# Patient Record
Sex: Male | Born: 1935 | Race: White | Hispanic: No | Marital: Married
Health system: Southern US, Community
[De-identification: ages and names within clinical notes are randomized; demographics above are authoritative.]

---

## 2003-04-23 ENCOUNTER — Other Ambulatory Visit: Payer: Self-pay

## 2003-04-27 ENCOUNTER — Other Ambulatory Visit: Payer: Self-pay

## 2006-04-16 ENCOUNTER — Inpatient Hospital Stay: Payer: Self-pay | Admitting: Internal Medicine

## 2006-04-16 ENCOUNTER — Other Ambulatory Visit: Payer: Self-pay

## 2006-04-21 ENCOUNTER — Inpatient Hospital Stay: Payer: Self-pay | Admitting: Cardiology

## 2006-04-21 ENCOUNTER — Other Ambulatory Visit: Payer: Self-pay

## 2006-04-26 ENCOUNTER — Ambulatory Visit: Payer: Self-pay | Admitting: Internal Medicine

## 2006-07-10 ENCOUNTER — Ambulatory Visit: Payer: Self-pay | Admitting: Specialist

## 2007-02-27 ENCOUNTER — Inpatient Hospital Stay: Payer: Self-pay | Admitting: Internal Medicine

## 2007-02-27 ENCOUNTER — Other Ambulatory Visit: Payer: Self-pay

## 2007-08-15 ENCOUNTER — Inpatient Hospital Stay: Payer: Self-pay | Admitting: Internal Medicine

## 2007-08-15 ENCOUNTER — Other Ambulatory Visit: Payer: Self-pay

## 2008-12-31 ENCOUNTER — Inpatient Hospital Stay: Payer: Self-pay | Admitting: Internal Medicine

## 2010-10-30 ENCOUNTER — Ambulatory Visit: Payer: Self-pay | Admitting: Physician Assistant

## 2011-02-27 ENCOUNTER — Inpatient Hospital Stay: Payer: Self-pay

## 2012-02-22 ENCOUNTER — Inpatient Hospital Stay: Payer: Self-pay | Admitting: Internal Medicine

## 2012-02-22 LAB — CK TOTAL AND CKMB (NOT AT ARMC): CK-MB: 3 ng/mL (ref 0.5–3.6)

## 2012-02-22 LAB — CBC
HCT: 45.4 % (ref 40.0–52.0)
MCH: 33.4 pg (ref 26.0–34.0)
MCV: 97 fL (ref 80–100)
RBC: 4.7 10*6/uL (ref 4.40–5.90)
WBC: 9.4 10*3/uL (ref 3.8–10.6)

## 2012-02-22 LAB — COMPREHENSIVE METABOLIC PANEL
Albumin: 3.5 g/dL (ref 3.4–5.0)
Anion Gap: 5 — ABNORMAL LOW (ref 7–16)
Chloride: 105 mmol/L (ref 98–107)
Co2: 30 mmol/L (ref 21–32)
Creatinine: 1.02 mg/dL (ref 0.60–1.30)
EGFR (African American): >60
EGFR (Non-African Amer.): >60
Osmolality: 280 (ref 275–301)
Potassium: 3.6 mmol/L (ref 3.5–5.1)
Sodium: 140 mmol/L (ref 136–145)

## 2012-02-22 LAB — PROTIME-INR: INR: 2.1

## 2012-02-22 LAB — URINALYSIS, COMPLETE
Bacteria: NONE SEEN
Blood: NEGATIVE
Glucose,UR: NEGATIVE mg/dL (ref 0–75)
Ketone: NEGATIVE
Protein: 30
Specific Gravity: 1.009 (ref 1.003–1.030)
WBC UR: 4 /HPF (ref 0–5)

## 2012-02-22 LAB — TROPONIN I: Troponin-I: <0.02 ng/mL

## 2012-02-22 LAB — MAGNESIUM: Magnesium: 1.8 mg/dL

## 2012-02-23 LAB — BASIC METABOLIC PANEL
Anion Gap: 4 — ABNORMAL LOW (ref 7–16)
BUN: 17 mg/dL (ref 7–18)
EGFR (African American): >60
EGFR (Non-African Amer.): 60 — ABNORMAL LOW
Glucose: 135 mg/dL — ABNORMAL HIGH (ref 65–99)
Osmolality: 287 (ref 275–301)
Sodium: 142 mmol/L (ref 136–145)

## 2012-02-23 LAB — TROPONIN I: Troponin-I: <0.02 ng/mL

## 2012-02-23 LAB — TSH: Thyroid Stimulating Horm: 0.635 u[IU]/mL

## 2012-02-23 LAB — PRO B NATRIURETIC PEPTIDE: B-Type Natriuretic Peptide: 4140 pg/mL — ABNORMAL HIGH (ref 0–450)

## 2012-02-23 LAB — LIPID PANEL
HDL Cholesterol: 54 mg/dL (ref 40–60)
Ldl Cholesterol, Calc: 44 mg/dL (ref 0–100)
Triglycerides: 36 mg/dL (ref 0–200)

## 2012-02-24 LAB — BASIC METABOLIC PANEL
Anion Gap: 8 (ref 7–16)
Calcium, Total: 8.9 mg/dL (ref 8.5–10.1)
Chloride: 99 mmol/L (ref 98–107)
Co2: 31 mmol/L (ref 21–32)
EGFR (African American): >60
Glucose: 127 mg/dL — ABNORMAL HIGH (ref 65–99)

## 2012-02-24 LAB — CBC WITH DIFFERENTIAL/PLATELET
Basophil #: 0 10*3/uL (ref 0.0–0.1)
Basophil %: 0 %
Eosinophil #: 0 10*3/uL (ref 0.0–0.7)
HCT: 42.7 % (ref 40.0–52.0)
Lymphocyte #: 0.7 10*3/uL — ABNORMAL LOW (ref 1.0–3.6)
Lymphocyte %: 6.4 %
MCH: 32.9 pg (ref 26.0–34.0)
MCHC: 34 g/dL (ref 32.0–36.0)
Monocyte #: 0.5 x10 3/mm (ref 0.2–1.0)
Monocyte %: 4.4 %
Neutrophil #: 10.3 10*3/uL — ABNORMAL HIGH (ref 1.4–6.5)
Platelet: 140 10*3/uL — ABNORMAL LOW (ref 150–440)
RDW: 15.1 % — ABNORMAL HIGH (ref 11.5–14.5)
WBC: 11.6 10*3/uL — ABNORMAL HIGH (ref 3.8–10.6)

## 2012-02-24 LAB — PROTIME-INR: Prothrombin Time: 35.3 secs — ABNORMAL HIGH (ref 11.5–14.7)

## 2012-02-25 LAB — CBC WITH DIFFERENTIAL/PLATELET
Eosinophil #: 0 10*3/uL (ref 0.0–0.7)
Eosinophil %: 0 %
HCT: 42.9 % (ref 40.0–52.0)
Lymphocyte #: 0.8 10*3/uL — ABNORMAL LOW (ref 1.0–3.6)
MCH: 33.6 pg (ref 26.0–34.0)
MCHC: 35 g/dL (ref 32.0–36.0)
MCV: 96 fL (ref 80–100)
Monocyte #: 0.9 x10 3/mm (ref 0.2–1.0)
Platelet: 140 10*3/uL — ABNORMAL LOW (ref 150–440)
RBC: 4.47 10*6/uL (ref 4.40–5.90)
RDW: 14.6 % — ABNORMAL HIGH (ref 11.5–14.5)
WBC: 11.4 10*3/uL — ABNORMAL HIGH (ref 3.8–10.6)

## 2012-02-25 LAB — BASIC METABOLIC PANEL
Anion Gap: 6 — ABNORMAL LOW (ref 7–16)
Calcium, Total: 8.7 mg/dL (ref 8.5–10.1)
Chloride: 99 mmol/L (ref 98–107)
Co2: 34 mmol/L — ABNORMAL HIGH (ref 21–32)
Creatinine: 1.3 mg/dL (ref 0.60–1.30)
EGFR (African American): 57 — ABNORMAL LOW
Sodium: 139 mmol/L (ref 136–145)

## 2012-02-25 LAB — PROTIME-INR
INR: 3.9
Prothrombin Time: 38.2 secs — ABNORMAL HIGH (ref 11.5–14.7)

## 2012-02-27 LAB — CULTURE, BLOOD (SINGLE)

## 2012-05-03 ENCOUNTER — Ambulatory Visit: Payer: Self-pay | Admitting: Internal Medicine

## 2012-05-09 ENCOUNTER — Inpatient Hospital Stay: Payer: Self-pay | Admitting: Internal Medicine

## 2012-05-09 LAB — TROPONIN I: Troponin-I: 0.03 ng/mL

## 2012-05-09 LAB — COMPREHENSIVE METABOLIC PANEL
Albumin: 3.5 g/dL (ref 3.4–5.0)
BUN: 14 mg/dL (ref 7–18)
Calcium, Total: 8.8 mg/dL (ref 8.5–10.1)
Chloride: 105 mmol/L (ref 98–107)
Co2: 29 mmol/L (ref 21–32)
Creatinine: 1.1 mg/dL (ref 0.60–1.30)
EGFR (African American): >60
EGFR (Non-African Amer.): >60
Glucose: 127 mg/dL — ABNORMAL HIGH (ref 65–99)
Potassium: 3.6 mmol/L (ref 3.5–5.1)
SGOT(AST): 38 U/L — ABNORMAL HIGH (ref 15–37)
SGPT (ALT): 49 U/L (ref 12–78)
Total Protein: 6.6 g/dL (ref 6.4–8.2)

## 2012-05-09 LAB — TSH: Thyroid Stimulating Horm: 3.15 u[IU]/mL

## 2012-05-09 LAB — CBC
HCT: 48.1 % (ref 40.0–52.0)
HGB: 16.2 g/dL (ref 13.0–18.0)
MCH: 33 pg (ref 26.0–34.0)
MCHC: 33.6 g/dL (ref 32.0–36.0)
RDW: 15.7 % — ABNORMAL HIGH (ref 11.5–14.5)

## 2012-05-09 LAB — PROTIME-INR: INR: 3.1

## 2012-05-09 LAB — CK TOTAL AND CKMB (NOT AT ARMC)
CK, Total: 108 U/L (ref 35–232)
CK-MB: 3 ng/mL (ref 0.5–3.6)

## 2012-05-10 LAB — BASIC METABOLIC PANEL
Creatinine: 1.27 mg/dL (ref 0.60–1.30)
EGFR (African American): 59 — ABNORMAL LOW
EGFR (Non-African Amer.): 51 — ABNORMAL LOW
Glucose: 134 mg/dL — ABNORMAL HIGH (ref 65–99)
Osmolality: 281 (ref 275–301)
Potassium: 4.2 mmol/L (ref 3.5–5.1)
Sodium: 139 mmol/L (ref 136–145)

## 2012-05-10 LAB — CBC WITH DIFFERENTIAL/PLATELET
Basophil %: 0.3 %
Eosinophil #: 0 10*3/uL (ref 0.0–0.7)
Eosinophil %: 0 %
HGB: 13.3 g/dL (ref 13.0–18.0)
Lymphocyte #: 0.6 10*3/uL — ABNORMAL LOW (ref 1.0–3.6)
MCH: 32.4 pg (ref 26.0–34.0)
MCHC: 33.4 g/dL (ref 32.0–36.0)
MCV: 97 fL (ref 80–100)
Platelet: 110 10*3/uL — ABNORMAL LOW (ref 150–440)
RBC: 4.09 10*6/uL — ABNORMAL LOW (ref 4.40–5.90)
RDW: 15.7 % — ABNORMAL HIGH (ref 11.5–14.5)

## 2012-05-10 LAB — PROTIME-INR
INR: 3.5
Prothrombin Time: 33.9 secs — ABNORMAL HIGH (ref 11.5–14.7)

## 2012-05-10 LAB — CK: CK, Total: 86 U/L (ref 35–232)

## 2012-05-11 LAB — BASIC METABOLIC PANEL
BUN: 16 mg/dL (ref 7–18)
Calcium, Total: 8.5 mg/dL (ref 8.5–10.1)
Co2: 30 mmol/L (ref 21–32)
Creatinine: 1.07 mg/dL (ref 0.60–1.30)
EGFR (African American): >60
Glucose: 86 mg/dL (ref 65–99)
Sodium: 141 mmol/L (ref 136–145)

## 2012-05-11 LAB — MAGNESIUM: Magnesium: 1.7 mg/dL — ABNORMAL LOW

## 2012-05-11 LAB — PROTIME-INR
INR: 4.3
Prothrombin Time: 39.4 secs — ABNORMAL HIGH (ref 11.5–14.7)

## 2012-05-12 LAB — MAGNESIUM: Magnesium: 2 mg/dL

## 2012-05-12 LAB — BASIC METABOLIC PANEL
BUN: 18 mg/dL (ref 7–18)
Chloride: 101 mmol/L (ref 98–107)
Co2: 33 mmol/L — ABNORMAL HIGH (ref 21–32)
EGFR (Non-African Amer.): >60
Sodium: 138 mmol/L (ref 136–145)

## 2012-05-12 LAB — PROTIME-INR: INR: 2.9

## 2012-05-13 LAB — BASIC METABOLIC PANEL
Anion Gap: 2 — ABNORMAL LOW (ref 7–16)
EGFR (African American): >60
EGFR (Non-African Amer.): 57 — ABNORMAL LOW
Glucose: 128 mg/dL — ABNORMAL HIGH (ref 65–99)
Osmolality: 279 (ref 275–301)
Potassium: 4 mmol/L (ref 3.5–5.1)
Sodium: 137 mmol/L (ref 136–145)

## 2012-05-13 LAB — PROTIME-INR
INR: 2.5
Prothrombin Time: 26.4 secs — ABNORMAL HIGH (ref 11.5–14.7)

## 2012-05-14 LAB — CULTURE, BLOOD (SINGLE)

## 2012-05-14 LAB — CBC WITH DIFFERENTIAL/PLATELET
Basophil #: 0.1 10*3/uL (ref 0.0–0.1)
Basophil %: 0.6 %
Eosinophil %: 6.1 %
MCHC: 34.2 g/dL (ref 32.0–36.0)
MCV: 97 fL (ref 80–100)
Monocyte #: 0.7 x10 3/mm (ref 0.2–1.0)
Neutrophil #: 5.8 10*3/uL (ref 1.4–6.5)
Neutrophil %: 65.2 %
Platelet: 140 10*3/uL — ABNORMAL LOW (ref 150–440)
RDW: 15.3 % — ABNORMAL HIGH (ref 11.5–14.5)
WBC: 8.9 10*3/uL (ref 3.8–10.6)

## 2012-05-26 ENCOUNTER — Inpatient Hospital Stay: Payer: Self-pay

## 2012-05-26 LAB — TROPONIN I
Troponin-I: <0.02 ng/mL
Troponin-I: <0.02 ng/mL

## 2012-05-26 LAB — CBC WITH DIFFERENTIAL/PLATELET
Eosinophil #: 0 10*3/uL (ref 0.0–0.7)
Eosinophil %: 0.3 %
HCT: 43.5 % (ref 40.0–52.0)
HGB: 14.7 g/dL (ref 13.0–18.0)
Lymphocyte #: 0.9 10*3/uL — ABNORMAL LOW (ref 1.0–3.6)
MCHC: 33.7 g/dL (ref 32.0–36.0)
Monocyte %: 6 %
Neutrophil #: 6.9 10*3/uL — ABNORMAL HIGH (ref 1.4–6.5)
Platelet: 139 10*3/uL — ABNORMAL LOW (ref 150–440)
RDW: 15.5 % — ABNORMAL HIGH (ref 11.5–14.5)
WBC: 8.3 10*3/uL (ref 3.8–10.6)

## 2012-05-26 LAB — COMPREHENSIVE METABOLIC PANEL
Albumin: 3.3 g/dL — ABNORMAL LOW (ref 3.4–5.0)
Alkaline Phosphatase: 106 U/L (ref 50–136)
Anion Gap: 6 — ABNORMAL LOW (ref 7–16)
BUN: 19 mg/dL — ABNORMAL HIGH (ref 7–18)
Bilirubin,Total: 1.1 mg/dL — ABNORMAL HIGH (ref 0.2–1.0)
Calcium, Total: 8.9 mg/dL (ref 8.5–10.1)
Chloride: 106 mmol/L (ref 98–107)
Creatinine: 1.03 mg/dL (ref 0.60–1.30)
EGFR (Non-African Amer.): >60
Glucose: 94 mg/dL (ref 65–99)
Potassium: 4.4 mmol/L (ref 3.5–5.1)
SGOT(AST): 32 U/L (ref 15–37)
SGPT (ALT): 26 U/L (ref 12–78)
Total Protein: 6.4 g/dL (ref 6.4–8.2)

## 2012-05-26 LAB — PROTIME-INR
INR: 1.8
Prothrombin Time: 21 secs — ABNORMAL HIGH (ref 11.5–14.7)

## 2012-05-26 LAB — SEDIMENTATION RATE: Erythrocyte Sed Rate: 2 mm/hr (ref 0–20)

## 2012-05-26 LAB — CK TOTAL AND CKMB (NOT AT ARMC)
CK, Total: 56 U/L (ref 35–232)
CK-MB: 1.7 ng/mL (ref 0.5–3.6)
CK-MB: 2 ng/mL (ref 0.5–3.6)

## 2012-05-26 LAB — URINALYSIS, COMPLETE
Nitrite: NEGATIVE
Ph: 7 (ref 4.5–8.0)
Specific Gravity: 1.002 (ref 1.003–1.030)
WBC UR: 2 /HPF (ref 0–5)

## 2012-05-27 LAB — BASIC METABOLIC PANEL
Anion Gap: 3 — ABNORMAL LOW (ref 7–16)
Calcium, Total: 9.2 mg/dL (ref 8.5–10.1)
Co2: 31 mmol/L (ref 21–32)
Creatinine: 1.02 mg/dL (ref 0.60–1.30)
EGFR (African American): >60
Glucose: 145 mg/dL — ABNORMAL HIGH (ref 65–99)
Osmolality: 284 (ref 275–301)
Sodium: 140 mmol/L (ref 136–145)

## 2012-05-27 LAB — CBC WITH DIFFERENTIAL/PLATELET
Basophil #: 0 10*3/uL (ref 0.0–0.1)
Eosinophil #: 0 10*3/uL (ref 0.0–0.7)
HCT: 41.6 % (ref 40.0–52.0)
HGB: 14.3 g/dL (ref 13.0–18.0)
Lymphocyte #: 0.5 10*3/uL — ABNORMAL LOW (ref 1.0–3.6)
MCH: 33.1 pg (ref 26.0–34.0)
MCHC: 34.4 g/dL (ref 32.0–36.0)
Monocyte #: 0.1 x10 3/mm — ABNORMAL LOW (ref 0.2–1.0)
Monocyte %: 2 %
Neutrophil #: 4.9 10*3/uL (ref 1.4–6.5)
Neutrophil %: 88.5 %
Platelet: 129 10*3/uL — ABNORMAL LOW (ref 150–440)
RBC: 4.32 10*6/uL — ABNORMAL LOW (ref 4.40–5.90)
WBC: 5.6 10*3/uL (ref 3.8–10.6)

## 2012-05-27 LAB — TROPONIN I: Troponin-I: <0.02 ng/mL

## 2012-05-27 LAB — CK TOTAL AND CKMB (NOT AT ARMC): CK-MB: 1.3 ng/mL (ref 0.5–3.6)

## 2012-05-28 LAB — BASIC METABOLIC PANEL
Anion Gap: 3 — ABNORMAL LOW (ref 7–16)
Calcium, Total: 8.7 mg/dL (ref 8.5–10.1)
Co2: 34 mmol/L — ABNORMAL HIGH (ref 21–32)
Creatinine: 1.13 mg/dL (ref 0.60–1.30)
EGFR (African American): >60
EGFR (Non-African Amer.): 59 — ABNORMAL LOW
Glucose: 90 mg/dL (ref 65–99)
Potassium: 3.6 mmol/L (ref 3.5–5.1)
Sodium: 139 mmol/L (ref 136–145)

## 2012-05-28 LAB — PLATELET COUNT: Platelet: 148 10*3/uL — ABNORMAL LOW (ref 150–440)

## 2012-05-29 LAB — BASIC METABOLIC PANEL
Anion Gap: 2 — ABNORMAL LOW (ref 7–16)
BUN: 20 mg/dL — ABNORMAL HIGH (ref 7–18)
Chloride: 101 mmol/L (ref 98–107)
Co2: 36 mmol/L — ABNORMAL HIGH (ref 21–32)
EGFR (African American): >60
EGFR (Non-African Amer.): >60
Glucose: 78 mg/dL (ref 65–99)
Potassium: 4.2 mmol/L (ref 3.5–5.1)

## 2012-05-30 LAB — BASIC METABOLIC PANEL
Anion Gap: 4 — ABNORMAL LOW (ref 7–16)
BUN: 30 mg/dL — ABNORMAL HIGH (ref 7–18)
Calcium, Total: 9.2 mg/dL (ref 8.5–10.1)
Chloride: 96 mmol/L — ABNORMAL LOW (ref 98–107)
Chloride: 94 mmol/L — ABNORMAL LOW (ref 98–107)
Co2: 35 mmol/L — ABNORMAL HIGH (ref 21–32)
EGFR (African American): 38 — ABNORMAL LOW
EGFR (Non-African Amer.): 33 — ABNORMAL LOW
Glucose: 114 mg/dL — ABNORMAL HIGH (ref 65–99)
Osmolality: 277 (ref 275–301)
Potassium: 4 mmol/L (ref 3.5–5.1)
Potassium: 4 mmol/L (ref 3.5–5.1)

## 2012-05-30 LAB — CBC WITH DIFFERENTIAL/PLATELET
Basophil #: 0 10*3/uL (ref 0.0–0.1)
Eosinophil #: 0 10*3/uL (ref 0.0–0.7)
Eosinophil %: 0 %
HCT: 43.7 % (ref 40.0–52.0)
HGB: 15.3 g/dL (ref 13.0–18.0)
Lymphocyte #: 0.8 10*3/uL — ABNORMAL LOW (ref 1.0–3.6)
Lymphocyte %: 4.4 %
MCH: 33.8 pg (ref 26.0–34.0)
MCV: 97 fL (ref 80–100)
Monocyte #: 1 x10 3/mm (ref 0.2–1.0)
Neutrophil #: 15.7 10*3/uL — ABNORMAL HIGH (ref 1.4–6.5)
Neutrophil %: 90.1 %
Platelet: 144 10*3/uL — ABNORMAL LOW (ref 150–440)
WBC: 17.4 10*3/uL — ABNORMAL HIGH (ref 3.8–10.6)

## 2012-05-30 LAB — PROTIME-INR
INR: 2.6
Prothrombin Time: 26.8 secs — ABNORMAL HIGH (ref 11.5–14.7)

## 2012-05-31 LAB — BASIC METABOLIC PANEL
Anion Gap: 3 — ABNORMAL LOW (ref 7–16)
Calcium, Total: 9.6 mg/dL (ref 8.5–10.1)
Chloride: 96 mmol/L — ABNORMAL LOW (ref 98–107)
Glucose: 108 mg/dL — ABNORMAL HIGH (ref 65–99)
Osmolality: 284 (ref 275–301)
Potassium: 4.3 mmol/L (ref 3.5–5.1)

## 2012-05-31 LAB — PROTIME-INR: INR: 2.6

## 2012-05-31 LAB — CBC WITH DIFFERENTIAL/PLATELET
Basophil #: 0 10*3/uL (ref 0.0–0.1)
Basophil %: 0 %
Eosinophil #: 0 10*3/uL (ref 0.0–0.7)
Eosinophil %: 0 %
HGB: 15.6 g/dL (ref 13.0–18.0)
Lymphocyte #: 1 10*3/uL (ref 1.0–3.6)
Lymphocyte %: 7.2 %
MCHC: 33.6 g/dL (ref 32.0–36.0)
MCV: 98 fL (ref 80–100)
Neutrophil #: 11.6 10*3/uL — ABNORMAL HIGH (ref 1.4–6.5)
RBC: 4.76 10*6/uL (ref 4.40–5.90)

## 2012-05-31 LAB — CREATININE, SERUM
Creatinine: 1.7 mg/dL — ABNORMAL HIGH (ref 0.60–1.30)
EGFR (Non-African Amer.): 36 — ABNORMAL LOW

## 2012-06-01 LAB — CBC WITH DIFFERENTIAL/PLATELET
Basophil #: 0 10*3/uL (ref 0.0–0.1)
Eosinophil #: 0 10*3/uL (ref 0.0–0.7)
HCT: 44.1 % (ref 40.0–52.0)
HGB: 15.4 g/dL (ref 13.0–18.0)
Lymphocyte #: 1 10*3/uL (ref 1.0–3.6)
Lymphocyte %: 7.8 %
MCH: 33.6 pg (ref 26.0–34.0)
MCHC: 34.8 g/dL (ref 32.0–36.0)
MCV: 97 fL (ref 80–100)
Monocyte #: 0.9 x10 3/mm (ref 0.2–1.0)
Neutrophil %: 84.9 %
RBC: 4.57 10*6/uL (ref 4.40–5.90)
RDW: 14.9 % — ABNORMAL HIGH (ref 11.5–14.5)
WBC: 12.9 10*3/uL — ABNORMAL HIGH (ref 3.8–10.6)

## 2012-06-01 LAB — BASIC METABOLIC PANEL
Anion Gap: 2 — ABNORMAL LOW (ref 7–16)
BUN: 32 mg/dL — ABNORMAL HIGH (ref 7–18)
Calcium, Total: 9.3 mg/dL (ref 8.5–10.1)
Chloride: 98 mmol/L (ref 98–107)
Creatinine: 1.35 mg/dL — ABNORMAL HIGH (ref 0.60–1.30)
EGFR (Non-African Amer.): 47 — ABNORMAL LOW
Glucose: 107 mg/dL — ABNORMAL HIGH (ref 65–99)
Osmolality: 281 (ref 275–301)
Sodium: 137 mmol/L (ref 136–145)

## 2012-06-01 LAB — CULTURE, BLOOD (SINGLE)

## 2012-06-03 ENCOUNTER — Ambulatory Visit: Payer: Self-pay | Admitting: Internal Medicine

## 2012-12-06 ENCOUNTER — Emergency Department: Payer: Self-pay | Admitting: Emergency Medicine

## 2012-12-06 LAB — COMPREHENSIVE METABOLIC PANEL
Albumin: 3.2 g/dL — ABNORMAL LOW (ref 3.4–5.0)
Alkaline Phosphatase: 88 U/L (ref 50–136)
BUN: 23 mg/dL — ABNORMAL HIGH (ref 7–18)
Bilirubin,Total: 1.4 mg/dL — ABNORMAL HIGH (ref 0.2–1.0)
Co2: 29 mmol/L (ref 21–32)
Creatinine: 1.55 mg/dL — ABNORMAL HIGH (ref 0.60–1.30)
EGFR (African American): 46 — ABNORMAL LOW
Glucose: 95 mg/dL (ref 65–99)
Osmolality: 279 (ref 275–301)
SGPT (ALT): 15 U/L (ref 12–78)
Sodium: 138 mmol/L (ref 136–145)
Total Protein: 6.1 g/dL — ABNORMAL LOW (ref 6.4–8.2)

## 2012-12-06 LAB — URINALYSIS, COMPLETE
Bilirubin,UR: NEGATIVE
Glucose,UR: NEGATIVE mg/dL (ref 0–75)
Leukocyte Esterase: NEGATIVE
Ph: 6 (ref 4.5–8.0)
Protein: NEGATIVE
RBC,UR: NONE SEEN /HPF (ref 0–5)
Specific Gravity: 1.011 (ref 1.003–1.030)
Squamous Epithelial: <1

## 2012-12-06 LAB — CBC WITH DIFFERENTIAL/PLATELET
Basophil %: 0.6 %
Eosinophil #: 0.5 10*3/uL (ref 0.0–0.7)
Eosinophil %: 4.7 %
HCT: 40.8 % (ref 40.0–52.0)
HGB: 14.4 g/dL (ref 13.0–18.0)
MCHC: 35.4 g/dL (ref 32.0–36.0)
MCV: 98 fL (ref 80–100)
Monocyte #: 0.8 x10 3/mm (ref 0.2–1.0)
Neutrophil #: 7.3 10*3/uL — ABNORMAL HIGH (ref 1.4–6.5)
Neutrophil %: 72.3 %
Platelet: 122 10*3/uL — ABNORMAL LOW (ref 150–440)
RBC: 4.15 10*6/uL — ABNORMAL LOW (ref 4.40–5.90)
RDW: 14.3 % (ref 11.5–14.5)
WBC: 10.1 10*3/uL (ref 3.8–10.6)

## 2012-12-06 LAB — CK TOTAL AND CKMB (NOT AT ARMC)
CK, Total: 33 U/L — ABNORMAL LOW (ref 35–232)
CK-MB: 0.9 ng/mL (ref 0.5–3.6)

## 2012-12-06 LAB — PROTIME-INR
INR: 1
Prothrombin Time: 13.5 secs (ref 11.5–14.7)

## 2012-12-06 LAB — TROPONIN I: Troponin-I: <0.02 ng/mL

## 2013-02-02 DEATH — deceased

## 2014-06-22 NOTE — H&P (Signed)
PATIENT NAMCloria Rush:  [name] MR#:  [id number] DATE OF BIRTH:  27-Apr-1923  DATE OF ADMISSION:  02/22/2012  PRIMARY CARE PHYSICIAN: Daniel NonesBert Klein, MD  REFERRING PHYSICIAN: Malachy Moanevainder Goli, MD  CHIEF COMPLAINT: Shortness of breath for 2 weeks, worsening today.   HISTORY OF PRESENT ILLNESS: The patient is an 81 year old Caucasian male with a past medical history of chronic respiratory failure, congestive heart failure, COPD, CAD, hyperlipidemia and hypertension who presented to the ED with shortness of breath for two weeks, worsening today. The patient has had shortness of breath for the past two weeks. In addition, the patient has a cough with discolored sputum, worsening leg edema, orthopnea and weight gain. The patient is on 3 liters home oxygen. He has been compliant to all the medications, but symptoms became worse today. He could not catch his breath so he was brought to the ED for further evaluation. He had low O2 oxygen saturation and was placed on BiPAP.   PAST MEDICAL HISTORY:  1. Chronic respiratory failure with COPD and pulmonary fibrosis, on home oxygen, 3 liters.  2. CAD status post RCA stent in 2005. 3. Hyperlipidemia.  4. Venous insufficiency with chronic lower extremity edema and varicosities. 5. Diastolic dysfunction CHF. 6. Atrial fibrillation, on Coumadin.  7. Hypertension.  8. Degenerative joint disease with multiple back surgeries and history of back fracture.   PERSONAL HISTORY: Denies any smoking or drinking or illicit drugs, but has distant tobacco use.   PAST SURGICAL HISTORY:  1. Back surgery. 2. Bilateral cataract surgery. 3. Tonsillectomy.   DRUG ALLERGIES: PENICILLIN.   FAMILY HISTORY: Father died of stroke and had a history of cancer. Mother had multiple sclerosis. A sister had cancer. Another brother had brain cancer.   HOME MEDICATIONS: 1. Warfarin 3 mg p.o. daily.  2. Vitamin D3 1000 units p.o. twice a day. 3. Vitamin C 1000 mg p.o. daily.   4. Metaxalone 800 mg p.o. tablet 1/2 to 1 tablet p.o. a day p.Rush.n.  5. Meclizine 12.5 mg p.o. 3 times daily p.Rush.n.  6. Levothyroxine 50 mcg p.o. once a day p.Rush.n.  7. HCTZ 12.5 mg p.o. daily.  8. Fish oil 1200 mg p.o. capsule 1 capsule twice a day. 9. DuoNebs 0.5 mg/2.5 mg/3 mL inhalation, 3 mL inhalation every 8 hours p.Rush.n.  10. Colace 100 mg p.o. daily.  11. Crestor 40 mg p.o. at bedtime.  12. Cardizem CD 300 mg p.o. daily.  13. Atenolol 50 mg p.o. 1 to 1-1/2 tablets p.o. daily.  14. Aspirin 81 mg p.o. daily.  15. Advair 100 mcg/50 mcg inhalation powder 1 puff once a daily.  REVIEW OF SYSTEMS:   CONSTITUTIONAL: The patient denies any fever or chills. No headache or dizziness. No weakness but has weight gain.   EYES: No double vision or blurred vision.   ENT: No postnasal drip, slurred speech or dysphagia. No epistaxis.   CARDIOVASCULAR: No chest pain or palpitations, but has orthopnea and questionable nocturnal dyspnea and also has leg edema.   PULMONARY: Positive for cough, sputum and shortness of breath, but no wheezing or hemoptysis.   GASTROINTESTINAL: No abdominal pain, nausea, vomiting or diarrhea. No melena or bloody stool.   GENITOURINARY: No dysuria, hematuria or incontinence.   SKIN: No rash or jaundice.   NEUROLOGY: No syncope, loss of consciousness or seizure.   HEMATOLOGY: No easy bruising or bleeding.   ENDOCRINE: No polyuria, polydipsia heat or cold intolerance.   PHYSICAL EXAMINATION:  VITAL SIGNS: Blood pressure 140/81, respirations 42  and 02 saturation 97% on BiPAP.   GENERAL: The patient is alert, awake and oriented, in no acute distress.   HEENT: Pupils are round, equal and reactive to light and accommodation. Moist oral mucosa. Clear oropharynx.   NECK: Supple. Mild JVD. No carotid bruits. No lymphadenopathy. No thyromegaly.   CARDIOVASCULAR: S1 and S2 irregular rate and rhythm, mild tachycardia. No murmurs or gallops.   PULMONARY: Bilateral  air entry. No wheezing but has rales bilaterally, especially in the right base.  No use of accessory muscles to breathe.   ABDOMEN: Soft. No distention or tenderness. Obese. Bowel sounds present. No organomegaly.   EXTREMITIES: Bilateral leg edema, 2 to 3+. No tenderness, clubbing or cyanosis. Pedal pulses present.   SKIN: No rash or jaundice.   NEUROLOGY: Alert and oriented x3. No focal deficit. Power 5/5. Sensation intact.   LABORATORY DATA: Urinalysis is negative. CK 116 and CK-MB 3.0. Glucose 89, BUN 15 and creatinine 1.02. Electrolytes are normal. CBC normal. Troponin less than 0.02. BNP 3343. Magnesium 1.8. INR 2.1.   RADIOLOGIC/DIAGNOSTIC DATA: Chest x-ray showed mild hypoinflated lungs with interstitial markings, low-grade CHF with small bilateral effusions, especially on the left side.   EKG shows sinus rhythm at 90 bpm with incomplete RBBB.   IMPRESSION: 1. Acute on chronic congestive heart failure, diastolic dysfunction.  2. Acute on chronic respiratory failure.  3. Chronic obstructive pulmonary disease.  4. Coronary artery disease.  5. Hypertension.  6. Hyperlipidemia.  7. Atrial fibrillation, on Coumadin.   PLAN OF TREATMENT: The patient will be admitted to the telemetry floor. We will start Lasix 20 mg IV twice a day and continue HCTZ. We will start CHF protocol with fluid restriction and daily weights. Continue aspirin, atenolol, Cardizem and HCTZ. Continue Crestor.  For COPD, we will continue DuoNebs p.Rush.n. with Advair. GI and DVT prophylaxis.   I discussed the patient's situation and plan of treatment with the patient, the patient's wife and the son.   TIME SPENT: About 62 minutes.  ___________________________ Shaune Pollack, MD qc:sb D: 02/22/2012 13:16:02 ET T: 02/22/2012 13:44:50 ET JOB#: [id number]  cc: Shaune Pollack, MD, <Dictator> Shaune Pollack MD ELECTRONICALLY SIGNED 02/22/2012 16:47

## 2014-06-22 NOTE — Discharge Summary (Signed)
PATIENT NAMEJOURDIN, [name] MR#:  [id number] DATE OF BIRTH:  Dec 07, 1923  DATE OF ADMISSION:  02/22/2012 DATE OF DISCHARGE:  02/25/2012  FINAL DIAGNOSES: 1. Acute on chronic left-sided systolic congestive heart failure.  2. Chronic obstructive pulmonary disease with acute exacerbation.  3. Acute on chronic respiratory failure secondary to #1 and #2.  4. Pulmonary fibrosis.  5. Coronary artery disease.  6. Hyperlipidemia.  7. Chronic venous insufficiency.  8. Atrial fibrillation, on chronic Coumadin therapy.  9. Hypertension.  10. Degenerative joint disease.  HISTORY AND PHYSICAL: Please see dictated admission history and physical.   SUMMARY OF HOSPITAL COURSE: The patient was admitted after being found to have marked hypoxia, with films revealing evidence of pulmonary edema and heart failure, as well as examination revealing evidence of COPD exacerbation.  He was placed on diuretics, started on steroids and treatment for his emphysema, and showed slow but steady improvement with this. His diuretics were held once his blood pressure began to fall, and his medications were adjusted to accommodate this. He was weaned down to 6 liters nasal cannula, which is the recommended baseline for the patient, although he has history of not being entirely compliant with this. He ambulated with physical therapy, and was able to go 110 feet, with his saturation ranging 84 to 88% on this, which is actually the patient's baseline.   The patient felt ready to go home and wished to do so. We had talked about rehabilitation which he was not interested in. We had talked about home health nursing and physical therapy for the patient, which he also declined. His family members are present, and they feel like he is close to baseline and is also ready to go home, so at this point he will be discharged home in stable condition. We recommend that he weigh himself daily and call his physician for more than 2 pounds gain in  one day or 5 pounds in one week or any other increasing signs and symptoms of congestive heart failure. Echocardiogram has been performed as his EF is above 40%, again this appears to be chronic left-sided diastolic congestive heart failure. He will follow a 2 gram sodium diet. He will follow up in our office within the next one week.   DISCHARGE MEDICATIONS: 1. Ranitidine 150 mg p.o. daily. 2. Advair 100/50 one puff twice a day. 3. Coumadin 2 mg at bedtime, starting on 02/27/2012.  4. Furosemide 40 mg p.o. daily p.r.n. edema or shortness of breath.  5. Levaquin 250 mg p.o. daily x 7 days.  6. DuoNeb SVN four times daily.  7. Metoprolol tartrate 25 mg 1/2 tablet p.o. twice a day. 8. Prednisone taper as directed.  9. Potassium 10 mEq p.o. daily p.r.n. with furosemide.  10. Levothyroxine 0.5 mg p.o. daily.  11. Metaxalone 800 mg 1/2 to 1 tablet p.o. daily as needed for back pain.  12. Colace 100 mg p.o. daily. 13. Vitamin D 1000 units p.o. twice a day.  14. Crestor 40 mg p.o. at bedtime.  15. Cardizem CD 300 mg p.o. daily.  16. Meclizine 12.5 mg p.o. 3 times daily as needed for dizziness.   The patient is not on aspirin secondary to his use of Coumadin and concerns about bleeding for this.   He was given instructions to hold atenolol, hydrochlorothiazide, fish oil, and vitamin C.  ____________________________ Lynnea Ferrier, MD bjk:sb D: 02/25/2012 13:46:16 ET T: 02/25/2012 13:53:48 ET JOB#: [id number]  cc: Curtis Sites III, MD, <  Dictator> Daniel NonesBERT KLEIN MD ELECTRONICALLY SIGNED 02/26/2012 8:14

## 2014-06-25 NOTE — Consult Note (Signed)
PATIENT NAMCloria Spring:  Rush, [name] MR#:  [id number] DATE OF BIRTH:  [birth year]  DATE OF CONSULTATION:  12/06/2012  REFERRING PHYSICIAN:  Dr. Fanny BienQuale in the Emergency Room.  CONSULTING PHYSICIAN:  Rick LopeJeffrey D. Judithann SheenSparks, MD  FAMILY PHYSICIAN:  Dr. Daniel NonesBert Rush.   REASON FOR ADMISSION:  Back pain.   HISTORY OF PRESENT ILLNESS:  The patient is an 81 year old male followed by Hospice with a history of chronic a-fib, chronic systolic CHF, pulmonary fibrosis, COPD, and chronic respiratory failure on oxygen. He has been at home being followed by Hospice. He presents to the Emergency Room with a week of worsening back pain, constipation. The patient has a significant history of end-stage lung disease and has been on steroids chronically in the past. In the Emergency Room, the patient was found to have an L4 compression fracture with 20% compression anteriorly and 15% compression posteriorly. No history of recent falls. The patient has been constipated. The family in the past deferred placement in a skilled nursing facility. They have also deferred Hospice Home. They want the patient to be comfortable and be kept at home as long as possible. The patient denies change in respiratory status. Denies chest pain or worsening shortness of breath.   PAST MEDICAL HISTORY:  1.  Pulmonary fibrosis.  2.  COPD.  3.  Chronic respiratory failure.  4.  ASCD status post PTCA with stent placement.  5.  Chronic atrial fibrillation.  6.  Chronic congestive heart failure.  7.  Venous stasis with peripheral edema.  8.  Osteoarthritis.  9.  Previous back surgery.  10.  Benign hypertension.   MEDICATIONS ON ADMISSION:  1.  Vitamin D3 1000 units p.o. b.i.d.  2.  Zantac 150 mg p.o. daily.  3.  Lopressor 25 mg p.o. b.i.d.  4.  Synthroid 0.05 mg p.o. daily. 5.  Lasix 80 mg p.o. daily.  6.  Advair 250/50 1 puff b.i.d.  7.  DuoNeb SVNs q.i.d.  8.  Cardizem CD 240 mg p.o. daily.  9.  Crestor 40 mg p.o. daily.  10.  Aspirin 81 mg p.o.  daily.   ALLERGIES:  PENICILLIN.   SOCIAL HISTORY:  The patient is a former smoker. No history of alcohol abuse.   FAMILY HISTORY:  Positive for stroke, multiple sclerosis, brain cancer, and other primary cancer, unknown.   REVIEW OF SYSTEMS:  CONSTITUTIONAL:  No fever or change in weight.  EYES:  No blurred or double vision. No glaucoma.  ENT:  No tinnitus or hearing loss. No nasal discharge or bleeding. No difficulty swallowing.  RESPIRATORY:  Has chronic cough, but denies wheezing. No hemoptysis. No painful respiration.  CARDIOVASCULAR:  No chest pain or palpitations. No syncope.  GASTROINTESTINAL:  No nausea, vomiting, or diarrhea. No abdominal pain. Has had constipation.  GENITOURINARY:  No dysuria or hematuria. No incontinence.  ENDOCRINE:  No polyuria or polydipsia. No heat or cold intolerance.  HEMATOLOGIC:  Denies anemia, easy bruising, or bleeding.  LYMPHATIC:  No swollen glands.  MUSCULOSKELETAL:  Has pain in his back but denies pain in his neck, shoulders, knees, or hips. No gout.  NEUROLOGIC:  No numbness or migraines. Denies stroke or seizures.  PSYCHIATRIC:  Denies anxiety, insomnia or depression.   PHYSICAL EXAMINATION:  GENERAL:  The patient is chronically ill-appearing, but in no acute distress.  VITAL SIGNS:  Currently remarkable for a blood pressure of 100/60, with a heart rate of 110 and respiratory rate of 23. Temperature 97.8.  HEENT:  Normocephalic, atraumatic. Pupils  equally round and reactive to light and accommodation. Extraocular movements are intact. Sclerae anicteric. Conjunctivae are clear.  OROPHARYNX:  Clear.  NECK:  Supple without JVD. No adenopathy or thyromegaly is noted.  LUNGS:  Reveal decreased breath sounds with basilar crackles. No significant rhonchi or wheezes. Respiratory effort is normal.  CARDIAC:  Irregularly irregular rhythm with a rapid rate. Normal S1, S2. No significant murmurs.  ABDOMEN:  Soft, nontender, with normoactive bowel  sounds. No organomegaly or masses were appreciated. No hernias or bruits were noted.  EXTREMITIES:  1+ edema. No clubbing or cyanosis. Pulses were 1+ bilaterally.  SKIN:  Warm and dry without rash or lesions.  NEUROLOGIC:  Cranial nerves II through XII grossly intact. Deep tendon reflexes were symmetric. Motor and sensory exam is nonfocal.  PSYCHIATRIC:  Revealed a patient is alert and oriented to person, place, and time. He was cooperative and used good judgment.   LABORATORY DATA:  CT of the abdomen revealed nephrolithiasis with mild gallbladder distention with stones. There is no gallbladder wall thickening or pericholecystic fluid. There is the L4 compression fracture noted above. Chest x-ray revealed mild chronic CHF, but was otherwise unremarkable. His white count was 10.1 with a hemoglobin of 14.4. Glucose 95 with a BUN of 13, creatinine of 1.55 with a sodium 138 and a potassium of 3.2, with a GFR of 40. His troponin was less than 0.02. Urinalysis was unremarkable.   ASSESSMENT:  1.  L4 compression fracture with back pain.  2.  Chronic respiratory failure on oxygen.  3.  Chronic obstructive pulmonary disease.  4.  Pulmonary fibrosis.  5.  Chronic atrial fibrillation.  6.  Chronic congestive heart failure.  7.  Chronic steroid use.  8.  Hypokalemia.  9.  Stage III chronic kidney disease.   PLAN:  The patient is not a candidate for surgical intervention for his lumbar compression fracture. The family was made aware of this. Pain management and physical therapy were recommended. The family prefers to do this at home if at all possible. They do not want the patient placed in a nursing home or the [facility]osp[facility]. They would prefer him not to be hospitalized if at all possible. As a result, Hospice was contacted and we will increase his pain medications. He will begin Roxanol 20 mg/mL 0.5 to 1 mL every two hours as needed for pain. We will add a Duragesic patch at 50 mcg topically q.3 days. We will  start empiric prednisone taper from 60 mg off over 6 days. Physical therapy through Hospice. Continue other routine medications. Follow up with Dr. Graciela Husbands in 1 to 2 weeks.   Thank you for the consultation. Please call if questions arise.   ____________________________ Rick Lope. Judithann Sheen, MD jds:jm D: 12/06/2012 10:56:04 ET T: 12/06/2012 11:27:35 ET JOB#: [id number]  cc: Rick Lope. Judithann Sheen, MD, <Dictator> Earnstine Meinders Rodena Medin MD ELECTRONICALLY SIGNED 12/06/2012 13:21
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
## 2014-06-25 NOTE — Discharge Summary (Signed)
PATIENT NAMCloria Rush:  [name], [name] MR#:  [id number] DATE OF BIRTH:  June 14, 1923  DATE OF ADMISSION:  05/26/2012 DATE OF DISCHARGE: 05/29/2012   FINAL DIAGNOSES:  1.  Acute on chronic respiratory failure.  2.  Acute on chronic left and right side systolic congestive heart failure, as cause of #1.  3.  Atrial fibrillation.  4.  Hyperlipidemia.  5.  Chronic obstructive pulmonary disease.  6.  Degenerative disk disease.  7.  Chronic venous insufficiency.   HISTORY AND PHYSICAL: Please see dictated admission history and physical.   SUMMARY OF HOSPITAL COURSE: The patient was admitted with increasing dyspnea, worsening of his baseline oxygen saturations, which are typically in the mid to upper 80 percentile range on 4 to 5 L nasal cannula. He was diuresed aggressively and his weight dropped to approximately 9 pounds during his hospitalization and his breathing improved markedly. He did have some episodes of increased heart rate, but this responded to adjustment of his medications. He was continued on Coumadin. He had no bleeding with this. Chest x-ray showed no evidence of pneumonia. He was placed on prednisone and this was tapered down out of concern of exacerbation of COPD. His COPD appeared to be at baseline. He was continued on home inhaled medications.  Previously, he had been seen by hospice, and hospice was asked to see him once again. At this point, the patient was willing to have hospice work with him at home, after multiple discussions with the patient and family members regarding the nature of his end-stage lung and heart disease, including the terminal, incurable and irreversible nature of these illnesses.   At this time, he will be discharged to home in stable condition with his physical activity to be up with a walker as tolerated. He will remain on 6 L nasal cannula. He should weigh himself daily, calling for more than 2 pound gain in 1 day or 5 pounds in 1 week or increasing signs or symptoms  of heart failure, with which he is familiar. He is DO NOT RESUSCITATE and out of facility DNR order was sent with him to home. He will follow a 2 gram sodium diet. He was not interested in physical therapy.   DISCHARGE MEDICATIONS:  1.  Meclizine 12.5 mg p.o. t.i.d. p.Rush.n. dizziness. 2.  Crestor 40 mg p.o. at bedtime.  3.  Vitamin D 1000 units p.o. b.i.d.  4.  Colace 100 mg p.o. daily.  5.  Ranitidine 150 mg p.o. daily.  6.  Aspirin 81 mg p.o. daily.  7.  Warfarin 2.5 mg p.o. daily.  8.  Lopressor 25 mg p.o. b.i.d.  9.  Advair 250/50, 1 puff b.i.d.  10. Cardizem CD 240 mg p.o. daily.  11. DuoNeb SVN every 8 hours.  12. Prednisone taper starting at 30 mg daily, decreasing by 10 mg every 2 days until done.  13. Spironolactone 25 mg p.o. daily.  14. Morphine 20 mg/mL, oral concentrate, 0.25 mL every 4 hours as needed for pain or dyspnea.  15. Furosemide 80 mg p.o. daily.  16. Levoxyl 0.05 mg p.o. daily.   He was given instructions to stop potassium. His Aldactone was begun. His potassium remained stable during this hospitalization.   ____________________________ Lynnea FerrierBert J. Klein III, MD bjk:aw D: 05/29/2012 08:19:20 ET T: 05/29/2012 09:42:28 ET JOB#: [id number]  cc: Lynnea FerrierBert J. Klein III, MD, <Dictator> Daniel NonesBERT KLEIN MD ELECTRONICALLY SIGNED 06/06/2012 12:53
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
## 2014-06-25 NOTE — Discharge Summary (Signed)
PATIENT NAME:  [name], [name]  DATE OF ADMISSION:  05/26/2012 DATE OF DISCHARGE:  06/01/2012  ADDENDUM:    NOTE:  Please see original discharge summary by Dr. Daniel NonesBert Klein dated March 27th.   HOSPITAL COURSE ADDITION: The  patient on the day of discharge had increased shortness of breath and oxygenation need. He also had increasing heart rate with atrial fibrillation. His discharge was canceled, and he was restarted on IV Lasix as well as an increased dose of Cardizem and Digoxin was added.  He continued on his steroids and other treatment.  Blood work was done and showed an increased BUN and creatinine as well as increased white count. He was started on levofloxacin for this. Over the next 3 days, he slowly improved with decreased shortness of breath and increased ability to mobilize. The day of discharge, he was able to walk around the nursing station but with continued dyspnea on exertion. His renal functional improved,  and on the day of discharge his BUN was 32 and his creatinine was back down to 1.35 from 1.85. His white blood count decreased as well to 12.9.   DISCHARGE MEDICATIONS: Please see discharge summary.  The only new medication is levofloxacin 500 mg daily for the next 6 days.   DISPOSITION:  The patient will be enrolled in home hospice. Again, the family was hesitant to have him go to a hospice home.   FOLLOWUP: The patient will follow up with Dr. Daniel NonesBert Klein as scheduled.  ____________________________ Stann Mainlandavid P. Sampson GoonFitzgerald, MD dpf:cb D: 06/01/2012 15:37:30 ET T: 06/01/2012 16:40:18 ET JOB#: [id number]  cc: Stann Mainlandavid P. Sampson GoonFitzgerald, MD, <Dictator> Shyana Kulakowski Sampson GoonFITZGERALD MD ELECTRONICALLY SIGNED 06/04/2012 15:43
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
## 2014-06-27 NOTE — Discharge Summary (Signed)
PATIENT NAMEELLERY, [name] MR#:  [id number] DATE OF BIRTH:  December 03, 1923  DATE OF ADMISSION:  02/27/2011 DATE OF DISCHARGE:  03/04/2011  DISCHARGE DIAGNOSES:  1. Acute worsening of chronic respiratory failure.  2. Chronic obstructive pulmonary disease exacerbation.  3. Rapid atrial fibrillation.  4. Acquired coagulopathy.  5. Prediabetes.  6. Leukocytosis. 7.   Mild mitral regurgitation and tricuspid regurgitation   ADMISSION DIAGNOSES:  1. Chronic respiratory failure due to pulmonary fibrosis, oxygen dependent.  2. Coronary artery disease, status post mid RCA stent.  3. Hyperlipidemia.  4. Venous insufficiency.  5. History of tobacco dependence.  6. Diastolic dysfunction, congestive heart failure.  7. Atrial fibrillation.  8. Chronic anticoagulation.  9. Hypertension.  10. Degenerative disk disease.  11. History of vertebral fracture   DISCHARGE MEDICATIONS:   1. Prednisone taper as directed.  2. Warfarin 3.5 mg daily.  3. Advair 100/50, 1 puff b.i.d.  4. Combivent 3 puffs every 6 hours p.r.n.  5. Alprazolam 0.25 every 8 hours p.r.n.  6. Vitamin C 1000 mg daily.  7. Docusate 100 mg b.i.d. daily p.r.n.  8. Aspirin 81 mg daily.  9. Fish oil 1000 mg t.i.d.  10. Meclizine 12.5 mg t.i.d.  11. Warfarin 3.5 mg daily.  12. DuoNebs inhaler 3 mL inhaled q.i.d. p.r.n.  13. Atenolol 75 mg daily.  14. Cardizem CD 300 mg extended release once daily.  15. Crestor 40 mg daily.  16. Vitamin D 1000 units daily.   HISTORY OF PRESENT ILLNESS: The patient is an 81 year old male with a history of chronic obstructive pulmonary disease and diastolic dysfunction who presented with two weeks of increased shortness of breath. He had been previously treated on December 10th with Levaquin for a bout of shortness of breath. He presented with hypoxia on his standard 4 liters with an oxygen saturation of 70 to 80%. He was initially treated with BiPAP and given Lasix for diuresis. He had had no fevers,  nausea, vomiting, or chest pain. Initial work-up revealed elevated white count of 14.4, elevated BNP of 2457, elevated INR of 4.7. EKG revealed atrial fibrillation with a rapid ventricular response, rate of 127. Chest x-ray showed evidence of pulmonary edema.   HOSPITAL COURSE: The patient was admitted and placed on telemetry. He was treated with nebulizer, Solu-Medrol, Advair for exacerbation of chronic obstructive pulmonary disease. He was initiated on antibiotics, and blood cultures were obtained which were negative. An echocardiogram was obtained which showed mild MR and TR, ejection fraction of 55%. He had gradual improvement and was able to tolerate a tapering dose of steroids. His Coumadin was initially held, and he was actually treated with one dose of vitamin K. His INR was followed, and his Coumadin was restarted prior to discharge. A repeat chest x-ray on 12/28 showed interval improvement with resolution of interstitial edema. Due to weakness likely due to deconditioning, Physical Therapy was asked to assist. He ambulated and had good improvement in strength. It was recommended he go to short-term rehab; however, the patient and his wife preferred that he would return home. He was also incidentally found to have an initial presenting blood sugar of 160. Hemoglobin A1c was elevated at 6.4% consistent with prediabetes. He did complete a five-day course of antibiotics during hospitalization.   DISCHARGE INSTRUCTIONS:  1. The patient will need a PT/INR check 3 to 4 days after discharge.  2. He should follow up with his PCP, Dr. Graciela Rush, in 1 to 2 weeks.  3. Complete tapering course  of prednisone.  ____________________________ A. Rick MolaMelissa Pao Haffey, MD ams:cbb   CC: Rick NonesBert Klein, MD  D: 03/04/2011 11:03:00 ET  T: 03/07/2011 13:20:18 ET  JOB#: [id number]  [name] Rick Gable MD ELECTRONICALLY SIGNED 03/14/2011 17:05

## 2014-06-27 NOTE — H&P (Signed)
PATIENT NAME:  [name], [name] MR#:  [id number] DATE OF BIRTH:  Oct 23, 1923  DATE OF ADMISSION:  02/27/2011  REFERRING PHYSICIAN: Dr. Carollee Massed  PRIMARY CARE PHYSICIAN:  Dr. Graciela Husbands  PRIMARY CARDIOLOGIST:  Dr. Darrold Junker  PRESENTING COMPLAINT: Labored breathing, shortness of breath, and weakness.   HISTORY OF PRESENT ILLNESS: Mr. [name] is a pleasant 81 year old gentleman with history of chronic respiratory failure/chronic obstructive pulmonary disease with pulmonary fibrosis, oxygen dependent on 3 liters, coronary artery disease status post stent placement, hyperlipidemia, diastolic dysfunction, and congestive heart failure who presents with complaints of worsening shortness of breath and labored breathing for the past two weeks. He reports initially starting with cough and shortness of breath and was seen at [facility] on 02/12/2011 and treated with Levaquin. He reports that he completed his Levaquin but continued to have worsening symptoms with regards to his shortness of breath. He increased his oxygen supplementation to 4 liters with much improvement. Per EMS his oxygen saturations were 70s to 80s on what appears to be 4 liters. He presents with oxygen sats of 68% in the Emergency Department on oxygen supplementation. The patient currently reports that his breathing has improved. He is status post being initiated on BiPAP and status post Lasix. He denies any chest pain. He endorses orthopnea. No changes in his weight or lower extremity swelling. Actually he has maybe even lost 2 pounds within the past two weeks. He denies any fevers, nausea, or vomiting. He does report feeling cold sweats and clammy in the ambulance.  He also endorses palpitations, but no presyncope or syncope.   PAST MEDICAL HISTORY:  1. Chronic respiratory failure/chronic obstructive pulmonary disease with pulmonary fibrosis, oxygen dependent of approximately 3 liters.  2. Coronary artery disease status post mid RCA stent in  February 2005.  3. Hyperlipidemia.  4. Venous insufficiency with chronic lower extremity edema and varicosities.  5. Distant tobacco use.  6. Diastolic dysfunction congestive heart failure.  7. Atrial fibrillation and on Coumadin.  8. Hypertension.  9. Degenerative disk disease with multiple back surgeries and with history of also back fractures.   PAST SURGICAL HISTORY:  1. Back surgeries as above.  2. Bilateral cataract surgeries.  3. Tonsillectomy.   ALLERGIES: Penicillin.   MEDICATIONS:  1. Zantac 150 mg daily.  2. Atenolol 75 mg daily.  3. Vitamin C 1000 mg daily.  4. Hydrochlorothiazide 12.5 mg daily.  5. Albuterol inhaler as needed.  6. Vitamin D 2000 international units daily.  7. Combivent inhaler every six hours as needed.  8. Crestor 40 mg at bedtime.  9. Cardizem CD 300 mg daily.  10. O2 3 liters continuous.  11. Warfarin back to 3.5 mg daily after completing Levaquin.  12. Fish oil b.i.d.  13. Metaxalone 400 mg t.i.d. as needed.  14. Colace 100 mg b.i.d. as needed.  15. DuoNeb 4 times daily as needed.  16. Advair 250/50 b.i.d.   FAMILY HISTORY: Father died of stroke, had history of cancer. Mother had multiple sclerosis. Sister had cancer. Another brother had brain cancer.   SOCIAL HISTORY: He lives in [place] with his wife. Quit cigar use in 2005. Denies any drug use.     REVIEW OF SYSTEMS: CONSTITUTIONAL: No fevers, nausea, or vomiting. EYES: No inflammation. He has a history of cataracts. ENT: No epistaxis or discharge. RESPIRATORY: Reports cough as per history of present illness from two weeks ago that is minimally productive. No hemoptysis. He endorses wheezing. CARDIOVASCULAR: No chest pain. Endorses orthopnea and unchanged lower  extremity edema. No presyncope or syncope but does endorse palpitations. GASTROINTESTINAL: No nausea, vomiting, or diarrhea. No abdominal pain, hematemesis, or melena. GU: No polyuria or polydipsia. HEMATOLOGIC: No easy bleeding. He  has easy bruising with being on the Coumadin. SKIN: No ulcers. MUSCULOSKELETAL: He has posterior head pain but no joint pain or swelling. NEUROLOGIC: No one-sided weakness or numbness. PSYCH: Denies any depression or suicidal ideation.   PHYSICAL EXAMINATION:  VITAL SIGNS: Pulse 133, respiratory rate 32, blood pressure 141/75, sating initially 68% on O2, currently in the low 90s on BiPAP.   GENERAL: Sitting in bed in no apparent distress.   HEENT: Normocephalic, atraumatic. Pupils are constricted. Anicteric sclerae. BiPAP in place.   NECK: Soft and supple. No adenopathy.   CARDIOVASCULAR: Mildly tachy. No murmurs, rubs, or gallops.   LUNGS: Faint expiratory wheezing. No use of accessory muscles or increased respiratory effort.   ABDOMEN: Soft and nontender. Positive bowel sounds.   EXTREMITIES: 1+ pitting edema bilaterally. Dorsal pedis pulses intact.   MUSCULOSKELETAL: No joint effusion.   SKIN: With scant ecchymosis.   NEUROLOGIC: Symmetrical strength. No focal deficits.   PSYCH: He is alert and oriented. The patient is cooperative.   PERTINENT LABS AND STUDIES: Troponin less than 0.02. CK 56, MB 1.1, INR 3.5. PTT 51.2. WBC 14.4, hemoglobin 14.3, hematocrit 43.3, platelets 187, MCV 95, glucose 167, BUN 14, creatinine 1.05, sodium 139, potassium 3.7, chloride 101, carbon dioxide 26, calcium 8.7, total bilirubin 1.2, alkaline phosphatase 77, ALT of 21, AST 30, total protein 6.7. BNP is 2457. Chest x-ray with pulmonary edema. EKG with atrial fibrillation with rapid ventricular response, rate of 127. No ST changes.   ASSESSMENT AND PLAN: Mr. [name] is an 81 year old gentleman with history of chronic respiratory failure, chronic obstructive pulmonary disease/pulmonary fibrosis on chronic oxygen, atrial fibrillation on Coumadin, coronary artery disease status post stent, diastolic dysfunction congestive heart failure presenting with respiratory distress.  1. Acute on chronic respiratory  failure/acute diastolic dysfunction congestive heart failure: Chest x-ray consistent with pulmonary edema. Continue with IV diuresis and continue on BiPAP. Follow ins and outs and daily weights. Continue SVNs, Solu-Medrol, and Advair. Restart atenolol and start patient on nitroglycerin patch. We will admit to telemetry. Cycle cardiac enzymes, obtain an echocardiogram. I do not see a recent echocardiogram within the past two years. Obtain cardiology consultation. He had a stress test back in August 2011 that was negative. We will send TSH, magnesium, A1c, and fasting lipid panel.  2. Acquired coagulopathy: INR is super therapeutic on Coumadin for atrial fibrillation. He had atrial fibrillation with rapid ventricular response on initial presentation but with control of his respiratory symptoms his heart rate has improved. Restart his atenolol and reduce his Coumadin dose to 3 mg. He recently completed Levaquin and that may explain why his INR is slightly elevated.  3. Leukocytosis: Likely stress margination. Continue to follow.  4. Hyperglycemia: Send A1c.  5. Prophylaxis with Coumadin and Zantac.   TIME SPENT: Approximately 50 minutes were spent on patient care.    ____________________________ Reuel DerbyAlounthith Alesha Jaffee, MD ap:bjt D: 02/27/2011 05:03:20 ET T: 02/27/2011 15:12:34 ET JOB#: [id number]  cc: Pearlean BrownieAlounthith Nylani Michetti, MD, <Dictator> Lynnea FerrierBert J. Klein III, MD Reuel DerbyALOUNTHITH Landri Dorsainvil MD ELECTRONICALLY SIGNED 03/24/2011 2:20

## 2014-06-27 NOTE — Discharge Summary (Signed)
PATIENT NAMECORDARO, [name] MR#:  [id number] DATE OF BIRTH:  December 22, 1923  DATE OF ADMISSION:  02/27/2011 DATE OF DISCHARGE:  03/04/2011  DISCHARGE DIAGNOSES:  1. Acute worsening of chronic respiratory failure.  2. Chronic obstructive pulmonary disease exacerbation.  3. Rapid atrial fibrillation.  4. Acquired coagulopathy.  5. Prediabetes.  6. Leukocytosis.  ADMISSION DIAGNOSES:  1. Chronic respiratory failure due to pulmonary fibrosis, oxygen dependent.  2. Coronary artery disease, status post mid RCA stent.  3. Hyperlipidemia.  4. Venous insufficiency.  5. History of tobacco dependence.  6. Diastolic dysfunction, congestive heart failure.  7. Atrial fibrillation.  8. Chronic anticoagulation.  9. Hypertension.  10. Degenerative disk disease.  11. History of vertebral fracture   DISCHARGE MEDICATIONS:   1. Prednisone taper as directed.  2. Warfarin 3.5 mg daily.  3. Advair 100/50, 1 puff b.i.d.  4. Combivent 3 puffs every 6 hours p.r.n.  5. Alprazolam 0.25 every 8 hours p.r.n.  6. Vitamin C 1000 mg daily.  7. Docusate 100 mg b.i.d. daily p.r.n.  8. Aspirin 81 mg daily.  9. Fish oil 1000 mg t.i.d.  10. Meclizine 12.5 mg t.i.d.  11. Warfarin 3.5 mg daily.  12. DuoNebs inhaler 3 mL inhaled q.i.d. p.r.n.  13. Atenolol 75 mg daily.  14. Cardizem CD 300 mg extended release once daily.  15. Crestor 40 mg daily.  16. Vitamin D 1000 units daily.   HISTORY OF PRESENT ILLNESS: The patient is an 81 year old male with a history of chronic obstructive pulmonary disease and diastolic dysfunction who presented with two weeks of increased shortness of breath. He had been previously treated on December 10th with Levaquin for a bout of shortness of breath. He presented with hypoxia on his standard 4 liters with an oxygen saturation of 70 to 80%. He was initially treated with BiPAP and given Lasix for diuresis. He had had no fevers, nausea, vomiting, or chest pain. Initial work-up revealed  elevated white count of 14.4, elevated BNP of 2457, elevated INR of 4.7. EKG revealed atrial fibrillation with a rapid ventricular response, rate of 127. Chest x-ray showed evidence of pulmonary edema.   HOSPITAL COURSE: The patient was admitted, he was placed on telemetry. He was treated with nebulizer, Solu-Medrol, Advair for exacerbation of chronic obstructive pulmonary disease. He was initiated on antibiotics, and blood cultures were obtained which were negative. An echocardiogram was obtained which showed mild mitral regurgitation, moderate tricuspid regurgitation, ejection fraction of 55%. He had gradual improvement and was able to tolerate a tapering dose of steroids. His Coumadin was initially held, and he was actually treated with one dose of vitamin K. His INR was followed, and his Coumadin was restarted prior to discharge. A repeat chest x-ray on 12/28 showed interval improvement with resolution of interstitial edema. Due to weakness likely due to deconditioning, Physical Therapy was asked to assist. He ambulated and had good improvement in strength. It was recommended he go to short-term rehab; however, the patient and his wife preferred that he would return home. He was also incidentally found to have an initial presenting blood sugar of 160. Hemoglobin A1c was elevated at 6.4% consistent with prediabetes. He did complete a five-day course of antibiotics during hospitalization.   DISCHARGE INSTRUCTIONS:  1. The patient will need a PT/INR check 3 to 4 days after discharge.  2. He should follow up with his PCP, Dr. Graciela Husbands, in 1 to 2 weeks.  3. Complete tapering course of prednisone.  ____________________________ A. Melissa Carrianne Hyun,  MD ams:cbb D: 03/04/2011 11:03:19 ET T: 03/07/2011 13:20:18 ET JOB#: [id number]

## 2014-08-23 IMAGING — CR DG CHEST 1V PORT
1 series · 1 of 1 positions shown · non-contrast
Comparison: none

REASON FOR EXAM: Shortness of Breath
COMMENTS:

PROCEDURE:     DXR - DXR PORTABLE CHEST SINGLE VIEW  - May 09, 2012  [DATE]
RESULT:     Comparison: 02/22/2012

[ap]
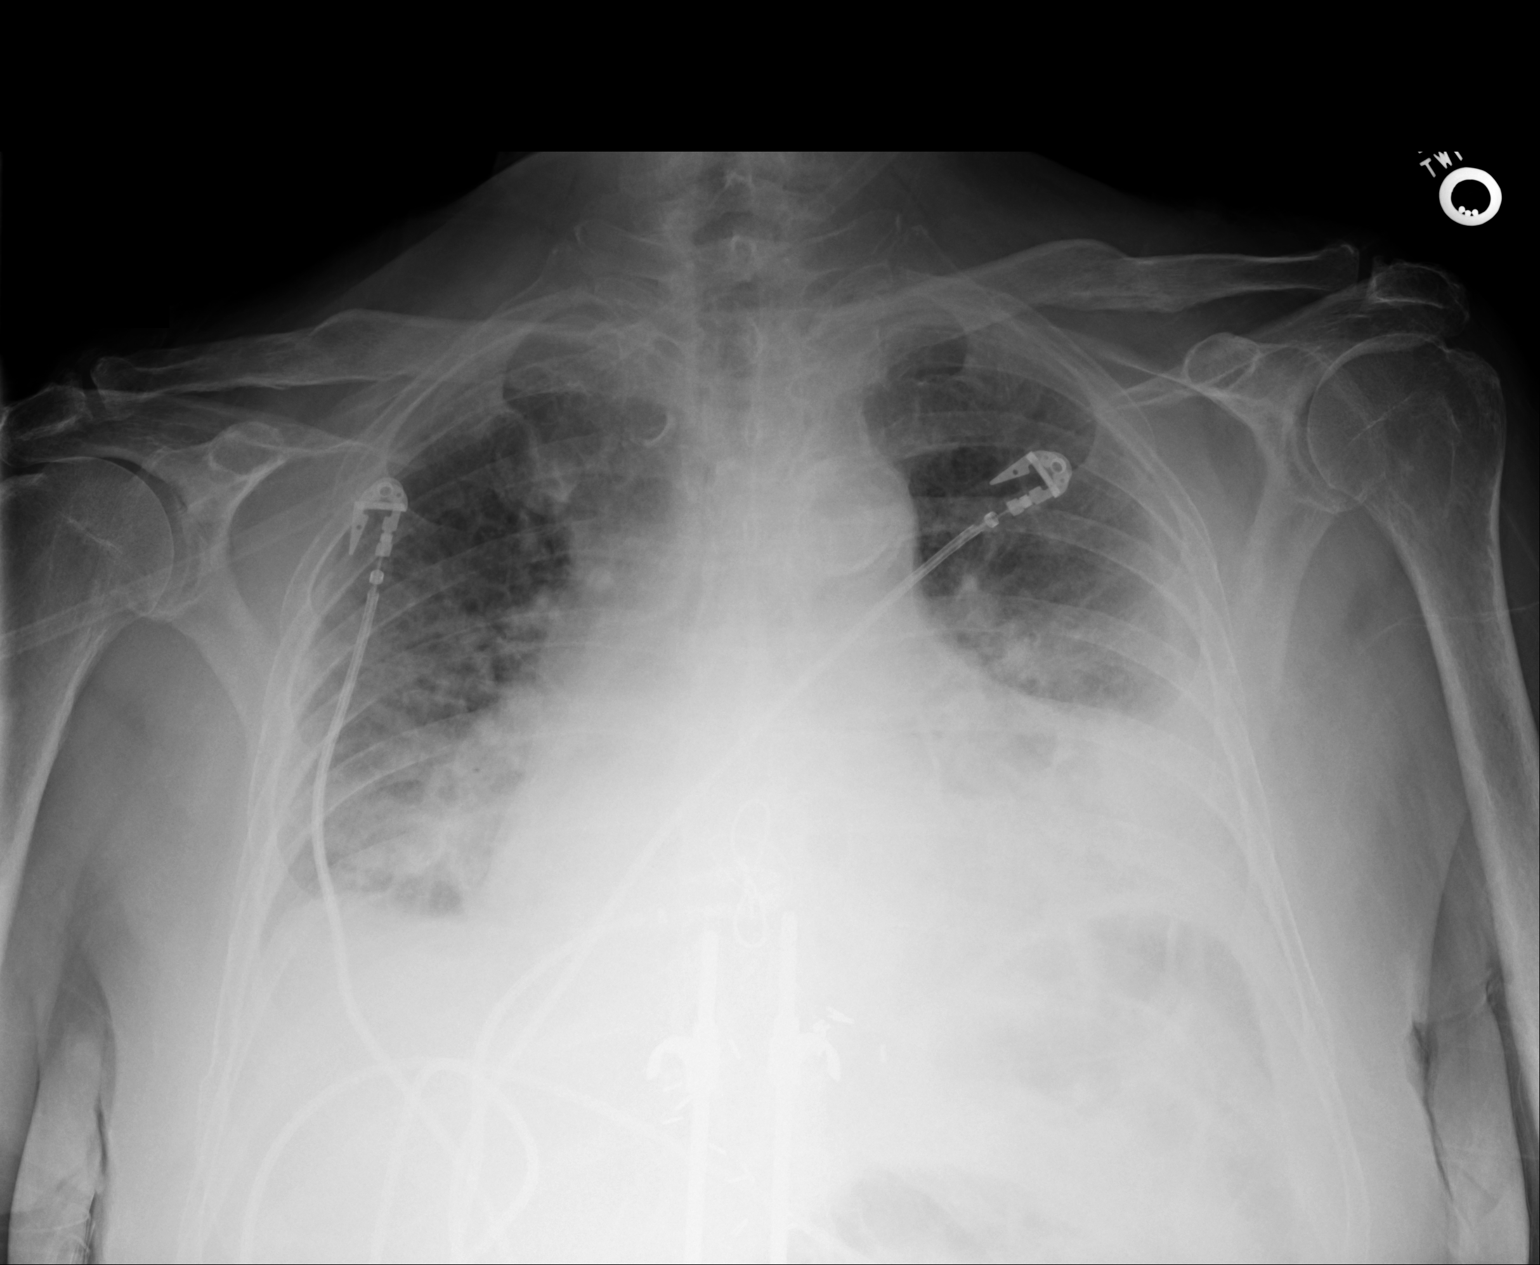

[1 of 1 positions shown; findings below may reference images not displayed]

FINDINGS: Cardiomegaly and mediastinum are similar to prior. The lung volumes are low.
Likely small left pleural effusion, similar to prior. There are bilateral
heterogeneous opacities likely secondary to pulmonary edema.
IMPRESSION: Low lung volumes with bilateral heterogeneous opacities likely secondary to
pulmonary edema.

## 2014-08-24 IMAGING — CR DG CHEST 1V PORT
1 series · 1 of 1 positions shown · non-contrast
Comparison: none

REASON FOR EXAM: inc. congestion
COMMENTS:

[ap]
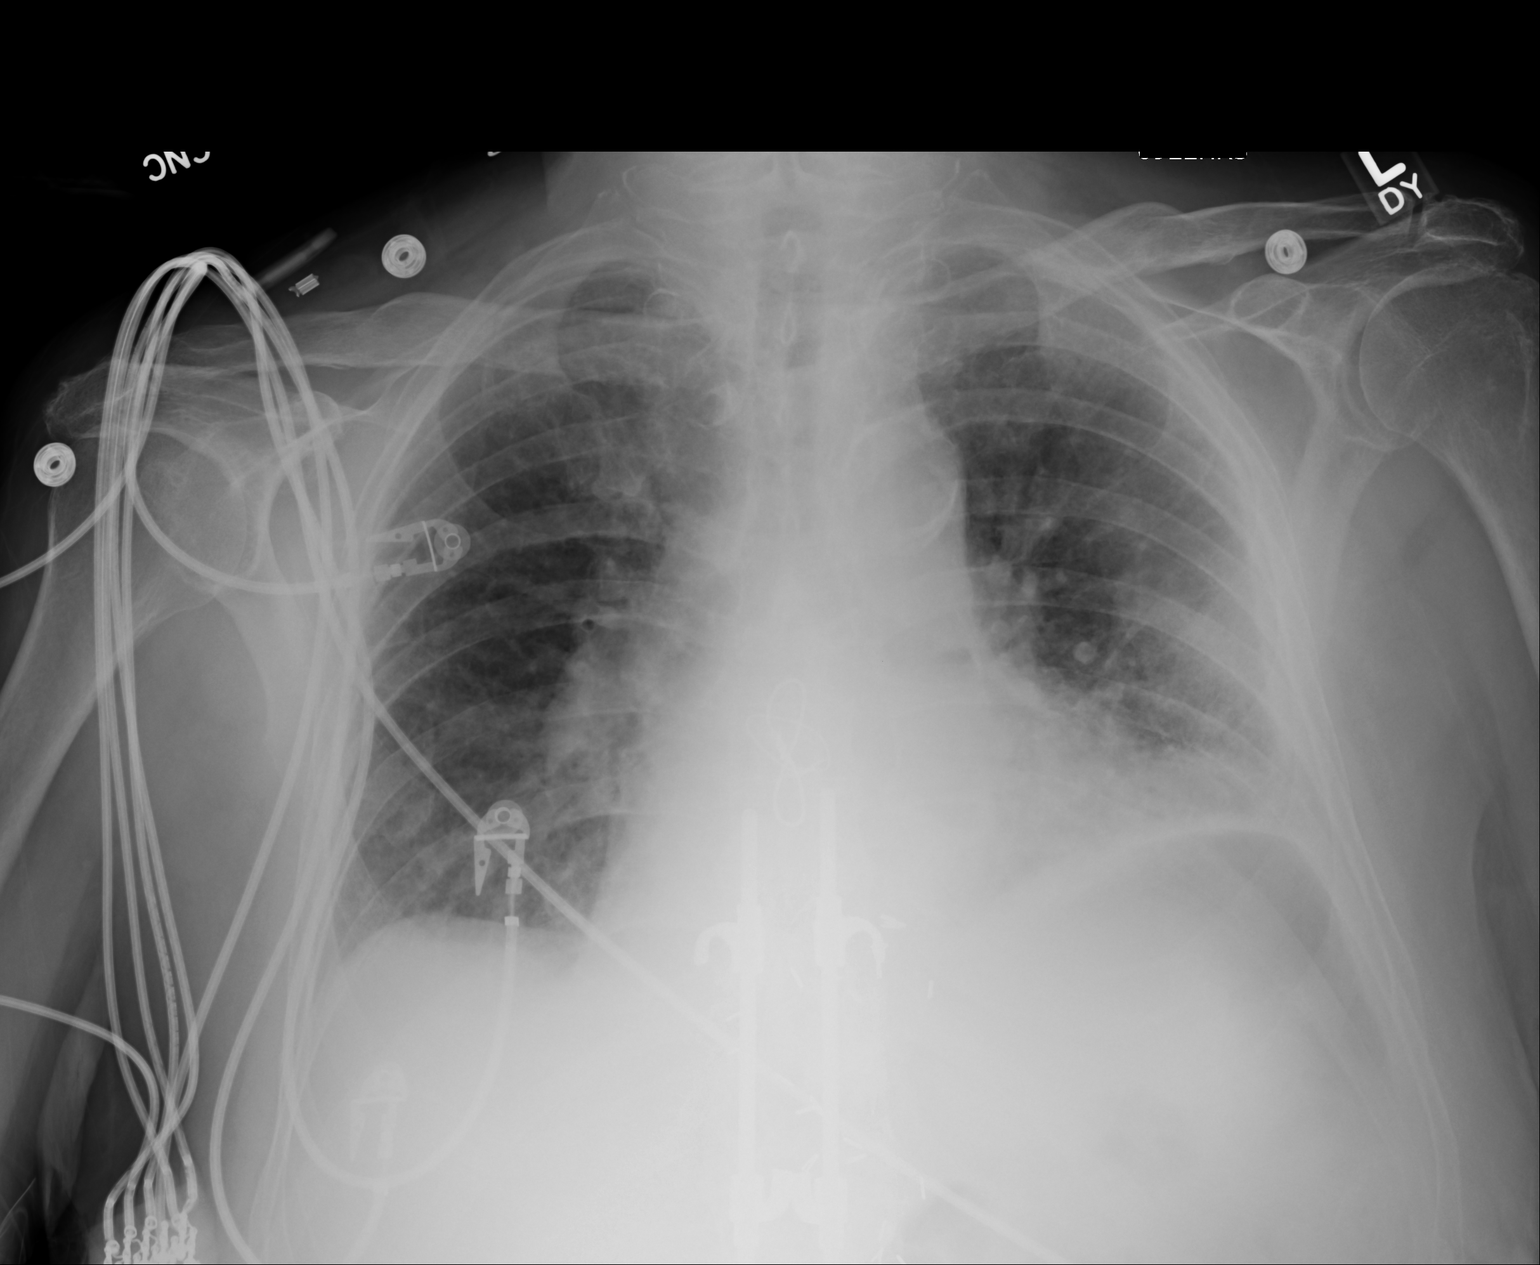

[1 of 1 positions shown; findings below may reference images not displayed]

PROCEDURE:     DXR - DXR PORTABLE CHEST SINGLE VIEW  - May 10, 2012  [DATE]

RESULT:     Comparison is made to the study 09 May, 2012.

There is hypoinflation with mild elevation of the left hemidiaphragm. There
is decreasing interstitial prominence with some persistent interstitial
infiltrate and some left lung base atelectasis or infiltrate. Trace left
pleural effusion is likely present. Cardiac monitoring electrodes are
present.
IMPRESSION: Slightly decreased interstitial infiltrate. Otherwise, no
significant change. Findings reflect decreasing pulmonary edema.

[REDACTED]
